# Patient Record
Sex: Female | Born: 1965 | Hispanic: Yes | Marital: Single
Health system: Southern US, Community
[De-identification: ages and names within clinical notes are randomized; demographics above are authoritative.]

## PROBLEM LIST (undated history)

## (undated) ENCOUNTER — Emergency Department: Admission: EM | Payer: No Typology Code available for payment source | Source: Home / Self Care

## (undated) DIAGNOSIS — I10 Essential (primary) hypertension: Secondary | ICD-10-CM

## (undated) DIAGNOSIS — E079 Disorder of thyroid, unspecified: Secondary | ICD-10-CM

---

## 2012-06-23 ENCOUNTER — Ambulatory Visit: Payer: Self-pay | Admitting: Family Medicine

## 2013-11-04 ENCOUNTER — Ambulatory Visit: Payer: Self-pay | Admitting: Internal Medicine

## 2014-12-08 ENCOUNTER — Ambulatory Visit: Payer: Self-pay | Admitting: Internal Medicine

## 2015-02-09 ENCOUNTER — Other Ambulatory Visit: Payer: Self-pay | Admitting: Internal Medicine

## 2015-02-09 DIAGNOSIS — N6489 Other specified disorders of breast: Secondary | ICD-10-CM

## 2015-02-09 DIAGNOSIS — R928 Other abnormal and inconclusive findings on diagnostic imaging of breast: Secondary | ICD-10-CM

## 2015-02-13 ENCOUNTER — Ambulatory Visit
Admission: RE | Admit: 2015-02-13 | Discharge: 2015-02-13 | Disposition: A | Discharge status: Home / Self Care | Payer: No Typology Code available for payment source | Source: Ambulatory Visit | Attending: Internal Medicine | Admitting: Internal Medicine

## 2015-02-13 ENCOUNTER — Ambulatory Visit: Payer: Self-pay

## 2015-02-13 DIAGNOSIS — R928 Other abnormal and inconclusive findings on diagnostic imaging of breast: Secondary | ICD-10-CM | POA: Insufficient documentation

## 2015-02-13 DIAGNOSIS — N6489 Other specified disorders of breast: Secondary | ICD-10-CM

## 2017-03-01 ENCOUNTER — Emergency Department
Admission: EM | Admit: 2017-03-01 | Discharge: 2017-03-01 | Disposition: A | Discharge status: Home / Self Care | Payer: No Typology Code available for payment source | Attending: Emergency Medicine | Admitting: Emergency Medicine

## 2017-03-01 ENCOUNTER — Encounter: Payer: Self-pay | Admitting: Emergency Medicine

## 2017-03-01 DIAGNOSIS — Z760 Encounter for issue of repeat prescription: Secondary | ICD-10-CM | POA: Insufficient documentation

## 2017-03-01 DIAGNOSIS — E079 Disorder of thyroid, unspecified: Secondary | ICD-10-CM | POA: Insufficient documentation

## 2017-03-01 DIAGNOSIS — I10 Essential (primary) hypertension: Secondary | ICD-10-CM | POA: Insufficient documentation

## 2017-03-01 HISTORY — DX: Essential (primary) hypertension: I10

## 2017-03-01 HISTORY — DX: Disorder of thyroid, unspecified: E07.9

## 2017-03-01 MED ORDER — LEVOTHYROXINE SODIUM 125 MCG PO TABS
125.0000 ug | ORAL_TABLET | Freq: Every day | ORAL | Status: DC
  Administered 2017-03-01: 125 ug via ORAL
  Filled 2017-03-01: qty 1

## 2017-03-01 MED ORDER — HYDROCHLOROTHIAZIDE 25 MG PO TABS
25.0000 mg | ORAL_TABLET | Freq: Every day | ORAL | Status: DC
  Administered 2017-03-01: 25 mg via ORAL
  Filled 2017-03-01: qty 1

## 2017-03-01 MED ORDER — LEVOTHYROXINE SODIUM 50 MCG PO TABS: 25.0000 ug | ORAL_TABLET | Freq: Every day | ORAL | Status: DC

## 2017-03-01 MED ORDER — LISINOPRIL 10 MG PO TABS
20.0000 mg | ORAL_TABLET | Freq: Once | ORAL | Status: AC
  Administered 2017-03-01: 20 mg via ORAL
  Filled 2017-03-01: qty 2

## 2017-03-01 MED ORDER — LEVOTHYROXINE SODIUM 50 MCG PO TABS: 100.0000 ug | ORAL_TABLET | Freq: Every day | ORAL | Status: DC

## 2017-03-01 MED ORDER — LISINOPRIL-HYDROCHLOROTHIAZIDE 20-25 MG PO TABS: 1.0000 | ORAL_TABLET | Freq: Every day | ORAL | 0 refills | Status: AC

## 2017-03-01 MED ORDER — LEVOTHYROXINE SODIUM 125 MCG PO CAPS: 125.0000 ug | ORAL_CAPSULE | Freq: Every day | ORAL | 0 refills | Status: AC
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
## 2017-03-01 NOTE — ED Notes (Signed)
Pt alert and oriented X4, active, cooperative, pt in NAD. RR even and unlabored, color WNL.  Pt informed to return if any life threatening symptoms occur.   
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
## 2018-01-29 ENCOUNTER — Other Ambulatory Visit: Payer: Self-pay | Admitting: Orthopedic Surgery

## 2018-01-29 ENCOUNTER — Other Ambulatory Visit
Admission: RE | Admit: 2018-01-29 | Discharge: 2018-01-29 | Disposition: A | Discharge status: Home / Self Care | Payer: Disability Insurance | Source: Ambulatory Visit | Attending: Orthopedic Surgery | Admitting: Orthopedic Surgery

## 2018-01-29 ENCOUNTER — Ambulatory Visit
Admission: RE | Admit: 2018-01-29 | Discharge: 2018-01-29 | Disposition: A | Discharge status: Home / Self Care | Payer: Disability Insurance | Source: Ambulatory Visit | Attending: Orthopedic Surgery | Admitting: Orthopedic Surgery

## 2018-01-29 DIAGNOSIS — M722 Plantar fascial fibromatosis: Secondary | ICD-10-CM | POA: Diagnosis not present

## 2018-01-29 DIAGNOSIS — M1990 Unspecified osteoarthritis, unspecified site: Secondary | ICD-10-CM

## 2018-01-29 DIAGNOSIS — E079 Disorder of thyroid, unspecified: Secondary | ICD-10-CM | POA: Insufficient documentation

## 2018-01-29 DIAGNOSIS — I10 Essential (primary) hypertension: Secondary | ICD-10-CM | POA: Diagnosis not present

## 2018-01-29 DIAGNOSIS — M779 Enthesopathy, unspecified: Secondary | ICD-10-CM | POA: Insufficient documentation

## 2019-01-13 IMAGING — CR DG LUMBAR SPINE 2-3V
3 series · 3 of 3 positions shown · non-contrast
Comparison: None.

CLINICAL DATA: Low back pain

EXAM:
LUMBAR SPINE - 2-3 VIEW

[l-spine ap]
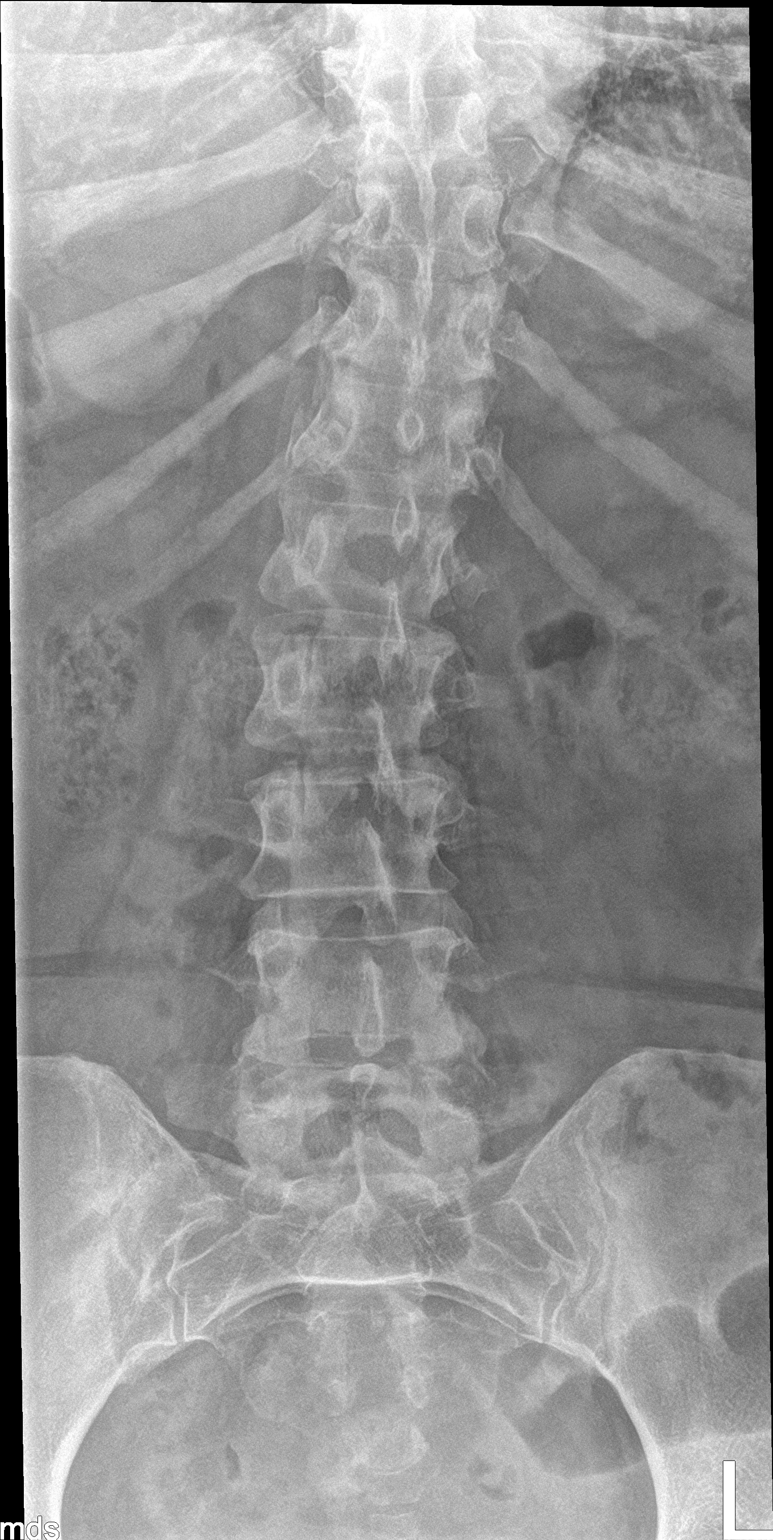

[l-spine lat]
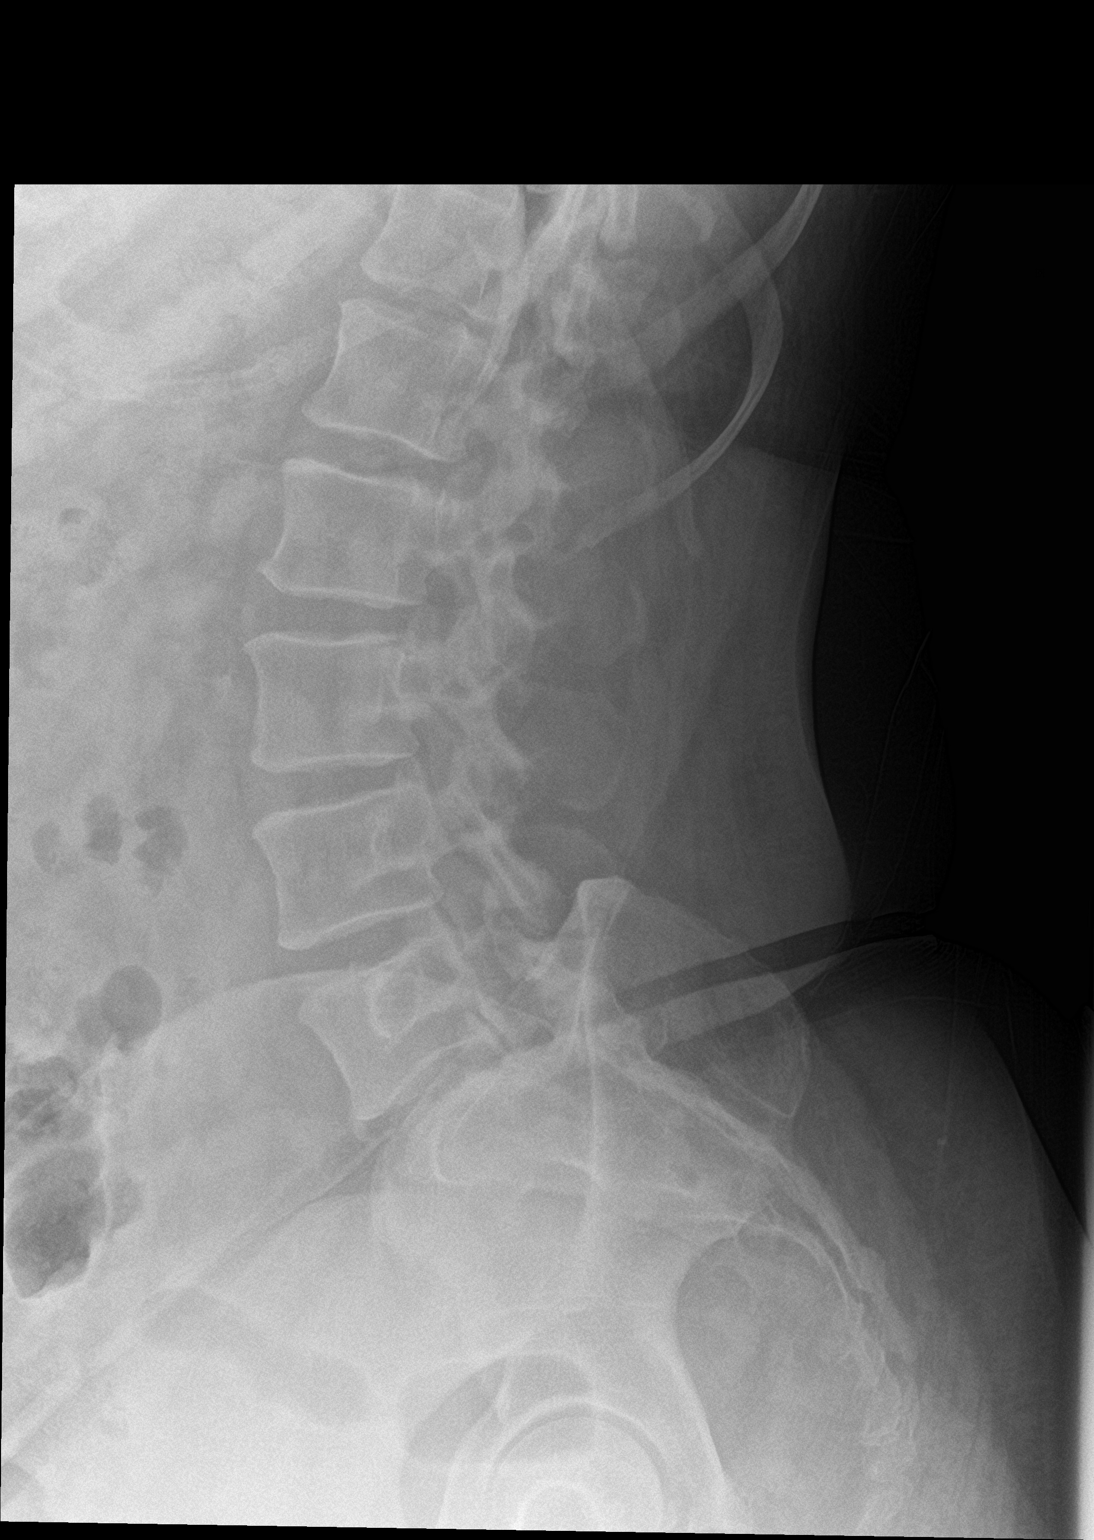

[l-spine spot]
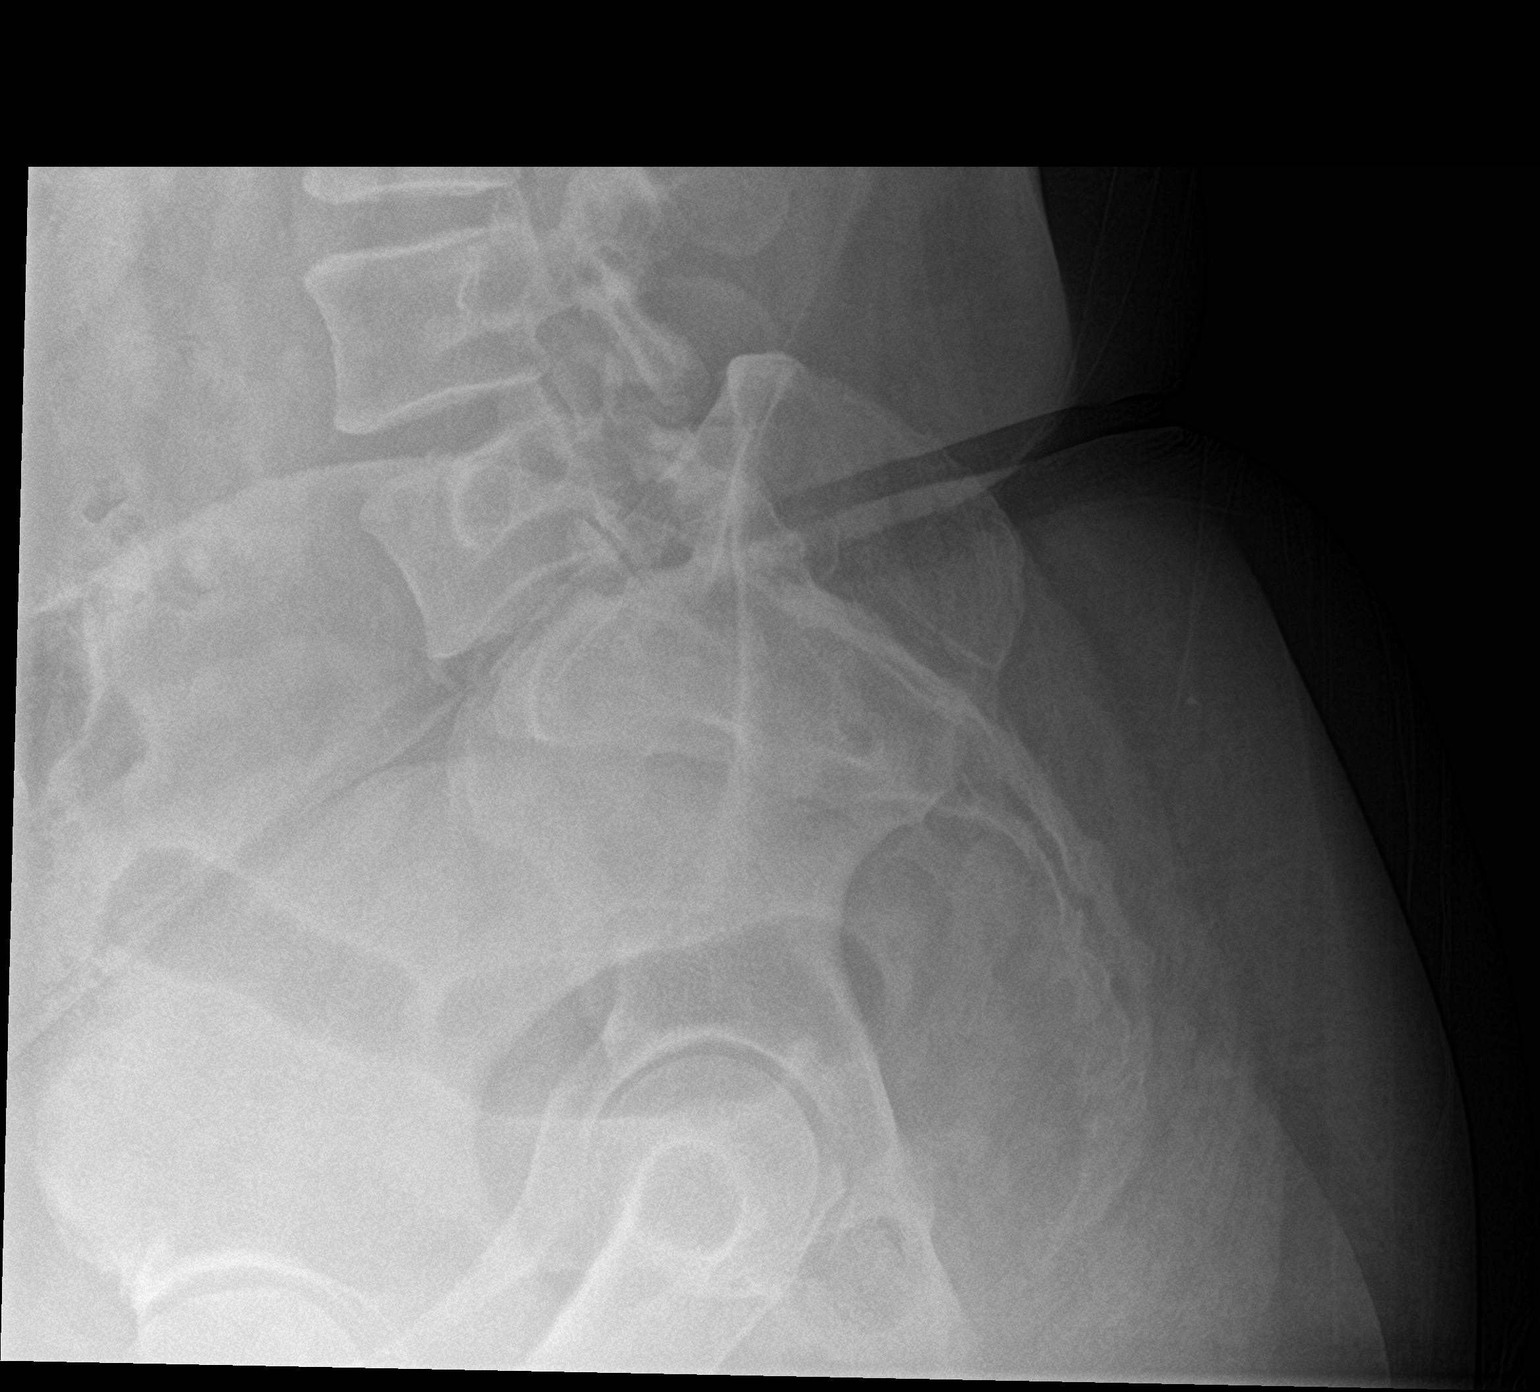

[3 of 3 positions shown; findings below may reference images not displayed]

FINDINGS: There has been mild curvature of the lower thoracic and upper lumbar
spine. On the lateral view however the lumbar vertebrae are normally
aligned with normal intervertebral disc spaces. No degenerative
change is seen. No compression deformity is noted. The SI joints
appear well corticated. The bowel gas pattern is nonspecific.
IMPRESSION: Normal intervertebral disc spaces with relatively normal alignment.
No significant degenerative change. No acute abnormality.
